# Patient Record
Sex: Male | Born: 1980 | Race: Black or African American | Hispanic: No | Marital: Married
Health system: Southern US, Community
[De-identification: ages and names within clinical notes are randomized; demographics above are authoritative.]

## PROBLEM LIST (undated history)

## (undated) DIAGNOSIS — F410 Panic disorder [episodic paroxysmal anxiety] without agoraphobia: Secondary | ICD-10-CM

## (undated) DIAGNOSIS — J45909 Unspecified asthma, uncomplicated: Secondary | ICD-10-CM

---

## 2007-10-07 ENCOUNTER — Emergency Department (HOSPITAL_COMMUNITY): Admission: EM | Admit: 2007-10-07 | Discharge: 2007-10-07 | Payer: Self-pay | Admitting: Emergency Medicine

## 2009-05-12 IMAGING — CR DG FINGER THUMB 2+V*R*
3 series · 3 of 3 positions shown · non-contrast
Comparison: None.

Exam: right thumb, 3 views.

HISTORY: Laceration to right thumb.

[x finger pa right]
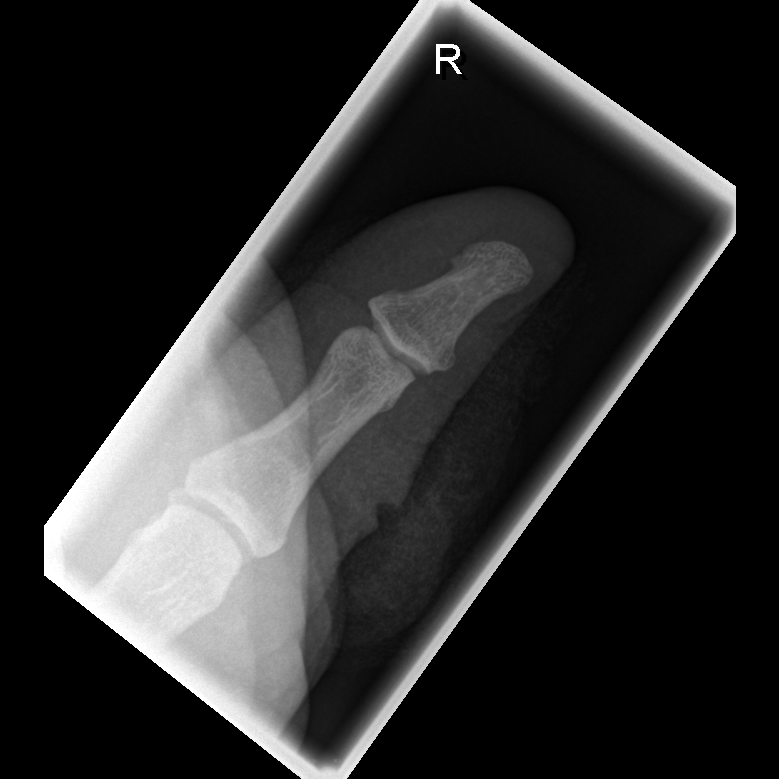

[x finger obl. right]
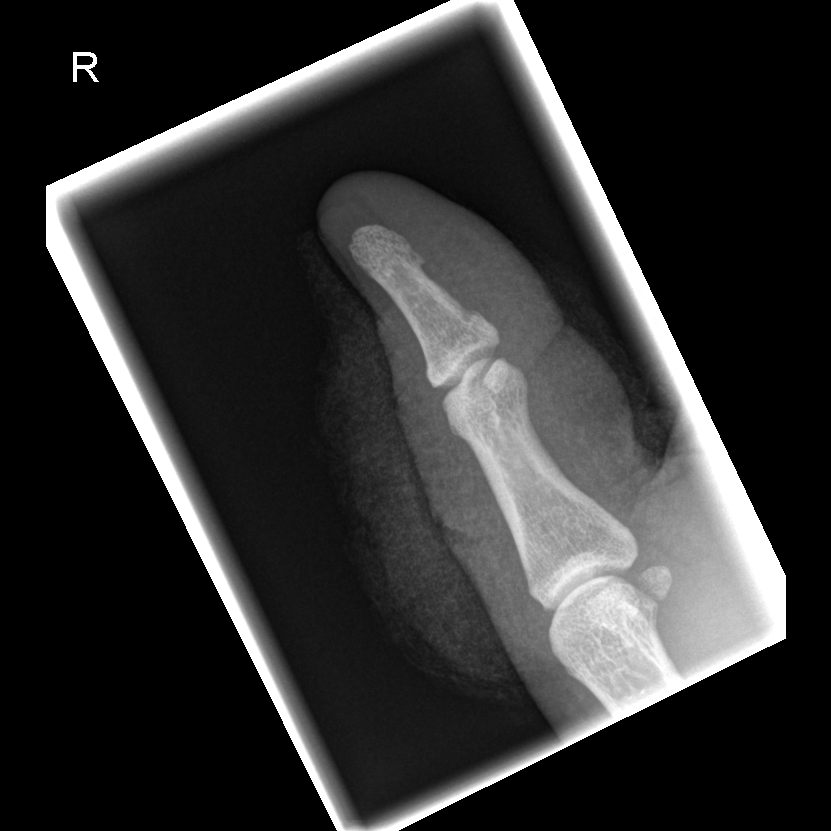

[x finger lateral right]
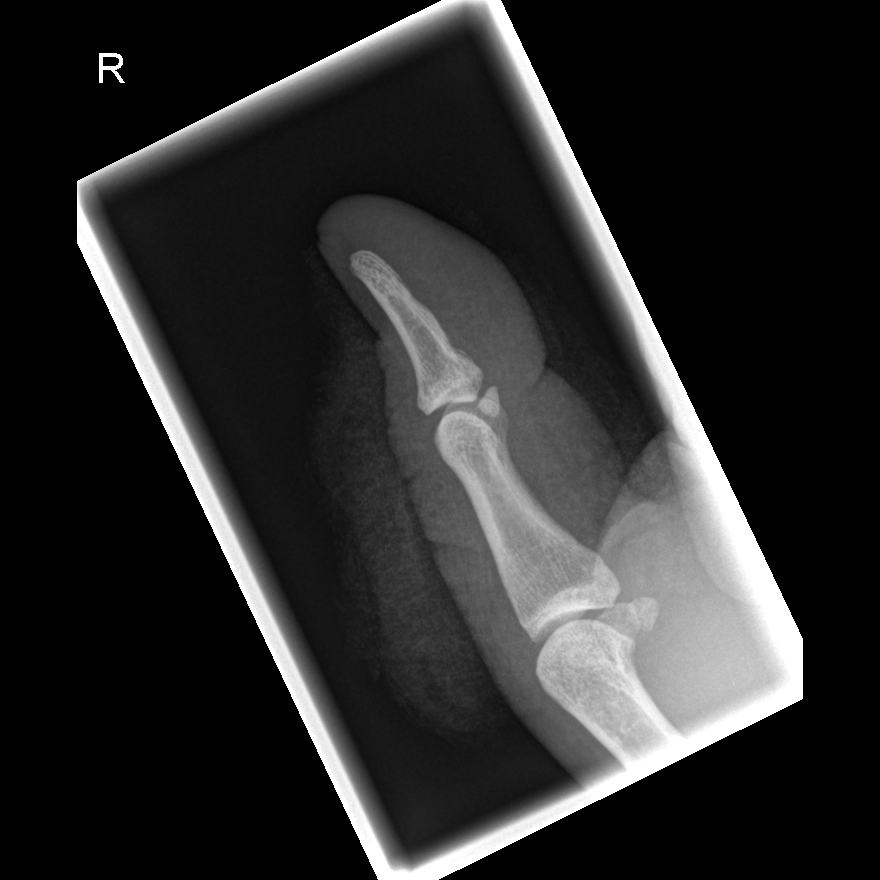

[3 of 3 positions shown; findings below may reference images not displayed]

FINDINGS: There is overlying bandage material obscuring fine bony detail.

A laceration is identified along the dorsolateral aspect of the right thumb.

There is no radiopaque foreign bodies or soft tissue calcifications.

The osseous structures are intact.
IMPRESSION: 1. No acute findings.

## 2016-07-07 ENCOUNTER — Emergency Department (HOSPITAL_COMMUNITY)
Admission: EM | Admit: 2016-07-07 | Discharge: 2016-07-07 | Discharge status: Absent without leave | Payer: BLUE CROSS/BLUE SHIELD

## 2016-07-07 ENCOUNTER — Encounter (HOSPITAL_COMMUNITY): Payer: Self-pay

## 2016-07-07 HISTORY — DX: Unspecified asthma, uncomplicated: J45.909

## 2016-07-07 HISTORY — DX: Panic disorder (episodic paroxysmal anxiety): F41.0
